# Patient Record
Sex: Female | Born: 1975 | Hispanic: No | Marital: Single | Smoking: Never smoker
Health system: Southern US, Community
[De-identification: ages and names within clinical notes are randomized; demographics above are authoritative.]

## PROBLEM LIST (undated history)

## (undated) DIAGNOSIS — E079 Disorder of thyroid, unspecified: Secondary | ICD-10-CM

## (undated) DIAGNOSIS — J302 Other seasonal allergic rhinitis: Secondary | ICD-10-CM

## (undated) DIAGNOSIS — D649 Anemia, unspecified: Secondary | ICD-10-CM

## (undated) HISTORY — DX: Anemia, unspecified: D64.9

## (undated) HISTORY — DX: Other seasonal allergic rhinitis: J30.2

## (undated) HISTORY — DX: Disorder of thyroid, unspecified: E07.9

---

## 1999-12-26 ENCOUNTER — Other Ambulatory Visit: Admission: RE | Admit: 1999-12-26 | Discharge: 1999-12-26 | Payer: Self-pay | Admitting: Obstetrics and Gynecology

## 2000-09-16 ENCOUNTER — Emergency Department (HOSPITAL_COMMUNITY): Admission: EM | Admit: 2000-09-16 | Discharge: 2000-09-17 | Payer: Self-pay | Admitting: Internal Medicine

## 2000-09-17 ENCOUNTER — Encounter: Payer: Self-pay | Admitting: Emergency Medicine

## 2001-06-03 ENCOUNTER — Other Ambulatory Visit: Admission: RE | Admit: 2001-06-03 | Discharge: 2001-06-03 | Payer: Self-pay | Admitting: *Deleted

## 2002-06-05 ENCOUNTER — Other Ambulatory Visit: Admission: RE | Admit: 2002-06-05 | Discharge: 2002-06-05 | Payer: Self-pay | Admitting: *Deleted

## 2003-02-19 ENCOUNTER — Other Ambulatory Visit: Admission: RE | Admit: 2003-02-19 | Discharge: 2003-02-19 | Payer: Self-pay | Admitting: Family Medicine

## 2004-05-02 ENCOUNTER — Emergency Department (HOSPITAL_COMMUNITY): Admission: EM | Admit: 2004-05-02 | Discharge: 2004-05-02 | Payer: Self-pay | Admitting: Emergency Medicine

## 2004-05-12 ENCOUNTER — Emergency Department (HOSPITAL_COMMUNITY): Admission: EM | Admit: 2004-05-12 | Discharge: 2004-05-12 | Payer: Self-pay | Admitting: Emergency Medicine

## 2004-06-27 ENCOUNTER — Other Ambulatory Visit: Admission: RE | Admit: 2004-06-27 | Discharge: 2004-06-27 | Payer: Self-pay | Admitting: Obstetrics and Gynecology

## 2004-12-27 ENCOUNTER — Other Ambulatory Visit: Admission: RE | Admit: 2004-12-27 | Discharge: 2004-12-27 | Payer: Self-pay | Admitting: Obstetrics and Gynecology

## 2005-03-27 ENCOUNTER — Other Ambulatory Visit: Admission: RE | Admit: 2005-03-27 | Discharge: 2005-03-27 | Payer: Self-pay | Admitting: Obstetrics and Gynecology

## 2006-02-20 ENCOUNTER — Other Ambulatory Visit: Admission: RE | Admit: 2006-02-20 | Discharge: 2006-02-20 | Payer: Self-pay | Admitting: Obstetrics and Gynecology

## 2006-12-04 HISTORY — PX: LAPAROSCOPY: SHX197

## 2007-01-17 ENCOUNTER — Emergency Department (HOSPITAL_COMMUNITY): Admission: EM | Admit: 2007-01-17 | Discharge: 2007-01-17 | Payer: Self-pay | Admitting: Emergency Medicine

## 2007-05-18 ENCOUNTER — Ambulatory Visit (HOSPITAL_COMMUNITY): Admission: RE | Admit: 2007-05-18 | Discharge: 2007-05-18 | Payer: Self-pay | Admitting: Obstetrics and Gynecology

## 2007-05-18 ENCOUNTER — Encounter (INDEPENDENT_AMBULATORY_CARE_PROVIDER_SITE_OTHER): Payer: Self-pay | Admitting: Obstetrics and Gynecology

## 2010-07-04 ENCOUNTER — Encounter: Admission: RE | Admit: 2010-07-04 | Discharge: 2010-07-04 | Payer: Self-pay | Admitting: Obstetrics and Gynecology

## 2011-04-18 NOTE — Op Note (Signed)
Summer Murillo, Summer Murillo                ACCOUNT NO.:  [id number]   MEDICAL RECORD NO.:  [id number]          PATIENT TYPE:  AMB   LOCATION:  [facility]C                           FACILITY:  [facility]   PHYSICIAN:  Hal Morales, M.D.DATE OF BIRTH:  Jul 03, 1976   DATE OF PROCEDURE:  05/18/2007  DATE OF DISCHARGE:                               OPERATIVE REPORT   PREOPERATIVE DIAGNOSIS:  Pelvic pain, family history of endometriosis.   POSTOPERATIVE DIAGNOSIS:  Probable endometriosis.   PROCEDURE:  Operative laparoscopy with peritoneal biopsies.   SURGEON:  Hal Morales, M.D.   ANESTHESIA:  General orotracheal.   ESTIMATED BLOOD LOSS:  Less than 10 mL.   COMPLICATIONS:  None.   FINDINGS:  The uterus, tubes and ovaries were within normal limits  without evidence of adhesions or endometriosis.  The anterior cul-de-sac  was free of any lesions.  The posterior cul-de-sac contained a stellate  peritoneal lesion in the right posterior cul-de-sac that could be  consistent with endometriosis.  There were also tiny peritoneal windows  in the peritoneum of the left uterosacral ligament and extending onto  the left pelvic sidewall.  There was a fairly classic powder burn area  directly adjacent to this.   DESCRIPTION OF PROCEDURE:  The patient was taken to the operating room  after appropriate identification and placed on the operating table.  After the attainment of adequate general anesthesia she was placed in  the modified lithotomy position.  The abdomen, perineum and vagina were  prepped with multiple layers of Betadine and a Foley catheter inserted  into the bladder and connected to straight drainage.  A single-tooth  tenaculum was placed on the anterior cervix.  An acorn cannula was  placed in the endocervical canal.  The abdomen was draped as a sterile  field.  Subumbilical and suprapubic injections of 0.25% Marcaine for  total of 10 mL were undertaken.  A subumbilical incision was made and  a  Veress cannula placed through that incision into the peritoneal cavity.  A pneumoperitoneum was created with 2.5 liters of CO2.  The Veress  cannula was removed and a 10-11 laparoscopic trocar placed through that  incision into the peritoneal cavity.  The laparoscope was placed through  the trocar sleeve.  A suprapubic incision was made to the left of  midline and a laparoscopic probe trocar placed through that incision  into the peritoneal cavity under direct visualization.  The above-noted  findings were made and documented.  Excisional biopsies of the two  aforementioned areas consistent with endometriosis were undertaken.  Hemostasis was noted to be adequate.  Irrigation was carried out with a  small amount of irrigant left in the peritoneal cavity.  All instruments  were then removed from the peritoneal cavity under direct visualization  as the CO2 was allowed to escape.  The subumbilical incision was closed  with a fascial suture of 0 Vicryl.  Subcuticular sutures of 0 Vicryl  were used to close the skin incision at the subumbilical and suprapubic  ports.  Sterile dressings were applied.  The acorn cannula and single-  tooth to the tenaculum were removed as was the Foley catheter.  The  patient was awakened from general anesthesia and taken to the recovery  room in satisfactory condition having tolerated the procedure well with  sponge and instrument counts correct.  She received Toradol 30 mg IV and  30 mg IM prior to leaving the operating suite.   SPECIMENS TO PATHOLOGY:  Peritoneal biopsies.   DISCHARGE INSTRUCTIONS:  Printed instructions for laparoscopy from the  [facility].   DISCHARGE MEDICATIONS:  Ibuprofen 600 mg p.o. q.6 h. p.r.n. pain,  Vicodin one to two p.o. q.4 h. p.r.n. severe pain.   FOLLOWUP:  The patient will follow up in 2 weeks.      Hal Morales, M.D.  Electronically Signed     VPH/MEDQ  D:  05/18/2007  T:  05/18/2007  Job:  [id number]

## 2011-04-18 NOTE — H&P (Signed)
NAME:  Summer Murillo, Summer Murillo                ACCOUNT NO.:  [id number]   MEDICAL RECORD NO.:  [id number]          PATIENT TYPE:  AMB   LOCATION:  SDC                           FACILITY:  [facility]   PHYSICIAN:  Hal Morales, M.D.DATE OF BIRTH:  Feb 23, 1976   DATE OF PROCEDURE:  DATE OF DISCHARGE:                    STAT - MUST CHANGE TO CORRECT WORK TYPE   HISTORY OF PRESENT ILLNESS:  The patient is a 35 year old, black, single  female, para 0, who presents for laparoscopic evaluation of pelvic pain.  The patient has a history of pelvic pain which dates back approximately  6 months ago.  The pain is primarily on placement of tampons.  She  actually denies any problems with dysmenorrhea or intermenstrual  abdominal pain.  She has regular menses, usually lasting for 4 days.  She is currently on Yasmin for control of her acne.  She had her last  menstrual period on Apr 10, 2007.  She has a strong family history of  endometriosis and wants to proceed with laparoscopic evaluation to  ensure that the above-mentioned symptomatology does not represent  endometriosis which needs to be managed.   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  She had low-grade SIL on colposcopically  directed biopsy.   OBSTETRICAL HISTORY:  Negative.   PAST MEDICAL HISTORY:  Significant only for allergies for which Zyrtec  has been required.   CURRENT MEDICATIONS:  Yasmin.   DRUG SENSITIVITIES:  PENICILLIN.   FAMILY HISTORY:  Positive for cancer, intestinal problems, hypertension,  diabetes, migraines, stroke, and emotional problems.   PAST SURGICAL HISTORY:  Removal of third molar.   REVIEW OF SYSTEMS:  Negative except as mentioned above.   PHYSICAL EXAMINATION:  VITAL SIGNS:  Blood pressure 100/60.  Height 5  feet 5-1/2 inches, weight 154 pounds.  HEENT:  Within normal limits.  LUNGS:  Clear.  HEART:  Regular rate and rhythm.  ABDOMEN:  Soft without masses  or organomegaly.  EXTREMITIES:  No clubbing, cyanosis, or edema.  PELVIC:  EGD and BUS within normal limits.  Vagina is rugous.  Cervix  without gross lesions.  The uterus is upper limits of normal size,  posterior, with mild tenderness on palpation.  Adnexa: No acceptable  masses.  Rectovaginal confirms.   IMPRESSION:  A six-month history of increasing abdominal and pelvic pain  with insertion of tampons and strong family history of endometriosis.   DISPOSITION:  The patient will undergo laparoscopic evaluation and  resection of endometriosis if possible. The risks of Anesthesia,  bleeding, infection, and damage to adjacent organs have all been  explained.  The additional risk of the inability to diagnose  endometriosis digitally has been explained, and the patient wishes to  proceed with laparoscopy.  This will be done at [facility] on May 18, 2007.      Hal Morales, M.D.     VPH/MEDQ  D:  05/06/2007  T:  05/06/2007  Job:  [id number]

## 2011-04-18 NOTE — H&P (Signed)
NAMEDAYSIE, HELF                ACCOUNT NO.:  [id number]   MEDICAL RECORD NO.:  [id number]          PATIENT TYPE:  AMB   LOCATION:  SDC                           [facility]  [facility]   PHYSICIAN:  Hal Morales, M.D.DATE OF BIRTH:  1976-11-11   DATE OF ADMISSION:  05/18/2007  DATE OF DISCHARGE:                              HISTORY & PHYSICAL   HISTORY OF PRESENT ILLNESS:  The patient is a 35 year old, black, single  female, para 0, who presents for laparoscopic evaluation of pelvic pain.  The patient has a history of pelvic pain which dates back approximately  6 months ago.  The pain is primarily on placement of tampons.  She  actually denies any problems with dysmenorrhea or intermenstrual  abdominal pain.  She has regular menses, usually lasting for 4 days.  She is currently on Yasmin for control of her acne.  She had her last  menstrual period on Apr 10, 2007.  She has a strong family history of  endometriosis and wants to proceed with laparoscopic evaluation to  ensure that the above-mentioned symptomatology does not represent  endometriosis which needs to be managed.   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  She had low-grade SIL on colposcopically  directed biopsy.   OBSTETRICAL HISTORY:  Negative.   PAST MEDICAL HISTORY:  Significant only for allergies for which Zyrtec  has been required.   CURRENT MEDICATIONS:  Yasmin.   DRUG SENSITIVITIES:  PENICILLIN.   FAMILY HISTORY:  Positive for cancer, intestinal problems, hypertension,  diabetes, migraines, stroke, and emotional problems.   PAST SURGICAL HISTORY:  Removal of third molar.   REVIEW OF SYSTEMS:  Negative except as mentioned above.   PHYSICAL EXAMINATION:  VITAL SIGNS:  Blood pressure 100/60.  Height 5  feet 5-1/2 inches, weight 154 pounds.  HEENT:  Within normal limits.  LUNGS:  Clear.  HEART:  Regular rate and rhythm.  ABDOMEN:  Soft without masses  or organomegaly.  EXTREMITIES:  No clubbing, cyanosis, or edema.  PELVIC:  EGD and BUS within normal limits.  Vagina is rugous.  Cervix  without gross lesions.  The uterus is upper limits of normal size,  posterior, with mild tenderness on palpation.  Adnexa: No acceptable  masses.  Rectovaginal confirms.   IMPRESSION:  A six-month history of increasing  pelvic pain with  insertion of tampons and strong family history of endometriosis.   DISPOSITION:  The patient will undergo laparoscopic evaluation and  resection of endometriosis if possible. The risks of Anesthesia,  bleeding, infection, and damage to adjacent organs have all been  explained.  The additional risk of the inability to diagnose  endometriosis visually has been explained, and the patient wishes to  proceed with laparoscopy.  This will be done at [facility] on May 18, 2007.      Hal Morales, M.D.  Electronically Signed     VPH/MEDQ  D:  05/06/2007  T:  05/06/2007  Job:  [id number]

## 2011-09-21 LAB — CBC
Platelets: 197
RBC: 5.14 — ABNORMAL HIGH

## 2011-09-21 LAB — PREGNANCY, URINE: Preg Test, Ur: NEGATIVE

## 2018-09-24 ENCOUNTER — Other Ambulatory Visit: Payer: Self-pay | Admitting: Obstetrics and Gynecology

## 2018-09-24 DIAGNOSIS — R928 Other abnormal and inconclusive findings on diagnostic imaging of breast: Secondary | ICD-10-CM

## 2018-09-27 ENCOUNTER — Other Ambulatory Visit: Payer: Self-pay | Admitting: Obstetrics and Gynecology

## 2018-09-27 ENCOUNTER — Ambulatory Visit
Admission: RE | Admit: 2018-09-27 | Discharge: 2018-09-27 | Disposition: A | Discharge status: Home / Self Care | Payer: Managed Care, Other (non HMO) | Source: Ambulatory Visit | Attending: Obstetrics and Gynecology | Admitting: Obstetrics and Gynecology

## 2018-09-27 DIAGNOSIS — N6489 Other specified disorders of breast: Secondary | ICD-10-CM

## 2018-09-27 DIAGNOSIS — R928 Other abnormal and inconclusive findings on diagnostic imaging of breast: Secondary | ICD-10-CM

## 2018-10-01 ENCOUNTER — Other Ambulatory Visit: Payer: Self-pay | Admitting: Obstetrics and Gynecology

## 2018-10-01 DIAGNOSIS — N6489 Other specified disorders of breast: Secondary | ICD-10-CM

## 2020-06-16 IMAGING — MG DIGITAL DIAGNOSTIC UNILATERAL RIGHT MAMMOGRAM WITH TOMO AND CAD
4 series · 4 of 12 positions shown · non-contrast
Comparison: Baseline screening mammogram 09/09/2018.

CLINICAL DATA: Screening recall from baseline for a right breast
asymmetry.

EXAM:
DIGITAL DIAGNOSTIC RIGHT MAMMOGRAM WITH CAD AND TOMO
ULTRASOUND RIGHT BREAST

[R ML synth-2D]
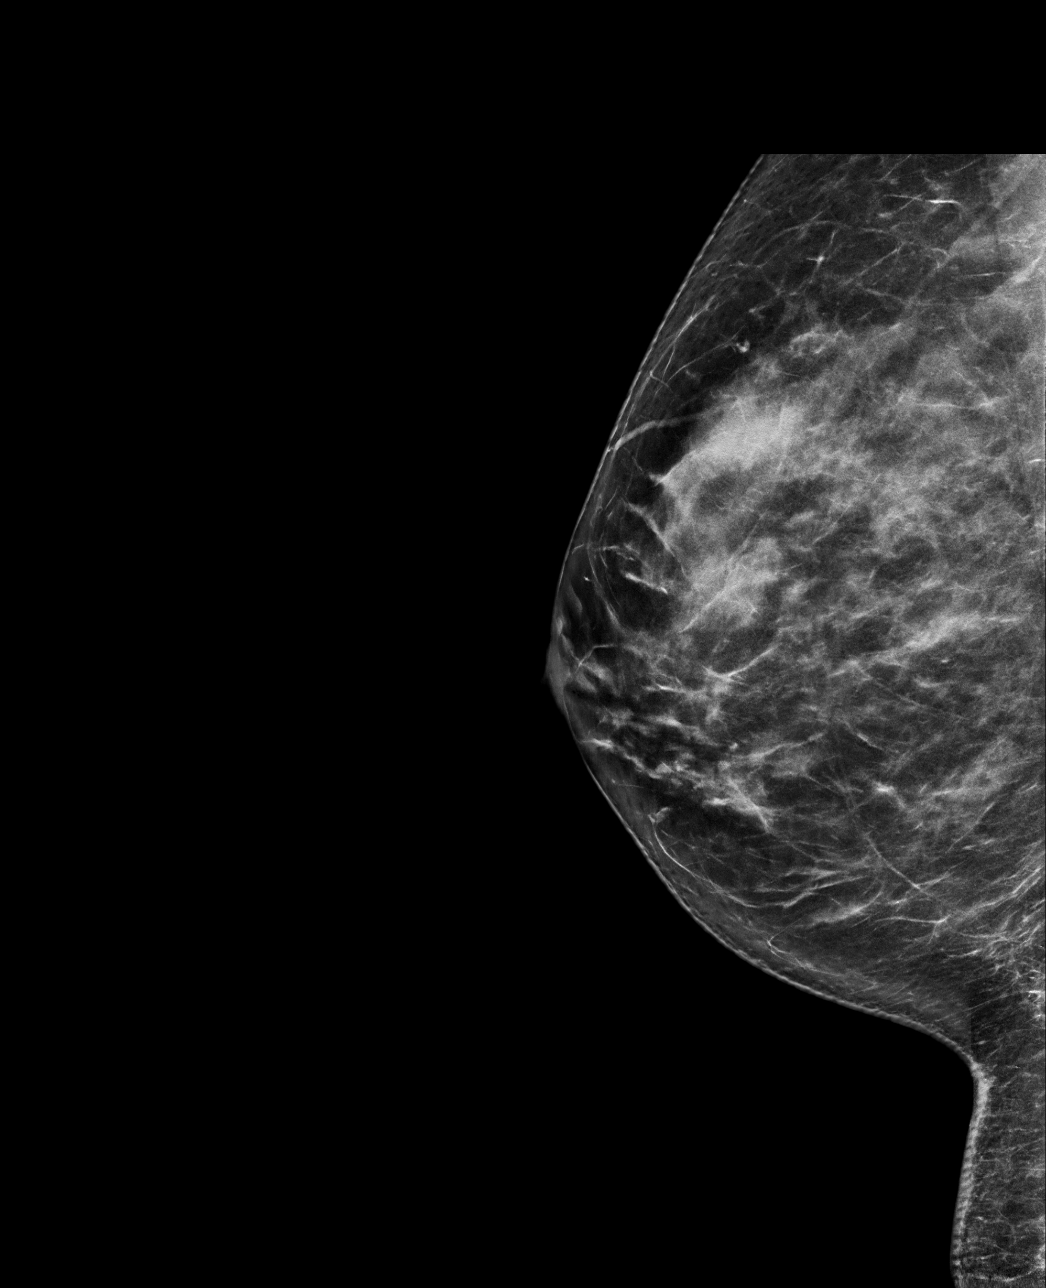

[R MLO synth-2D]
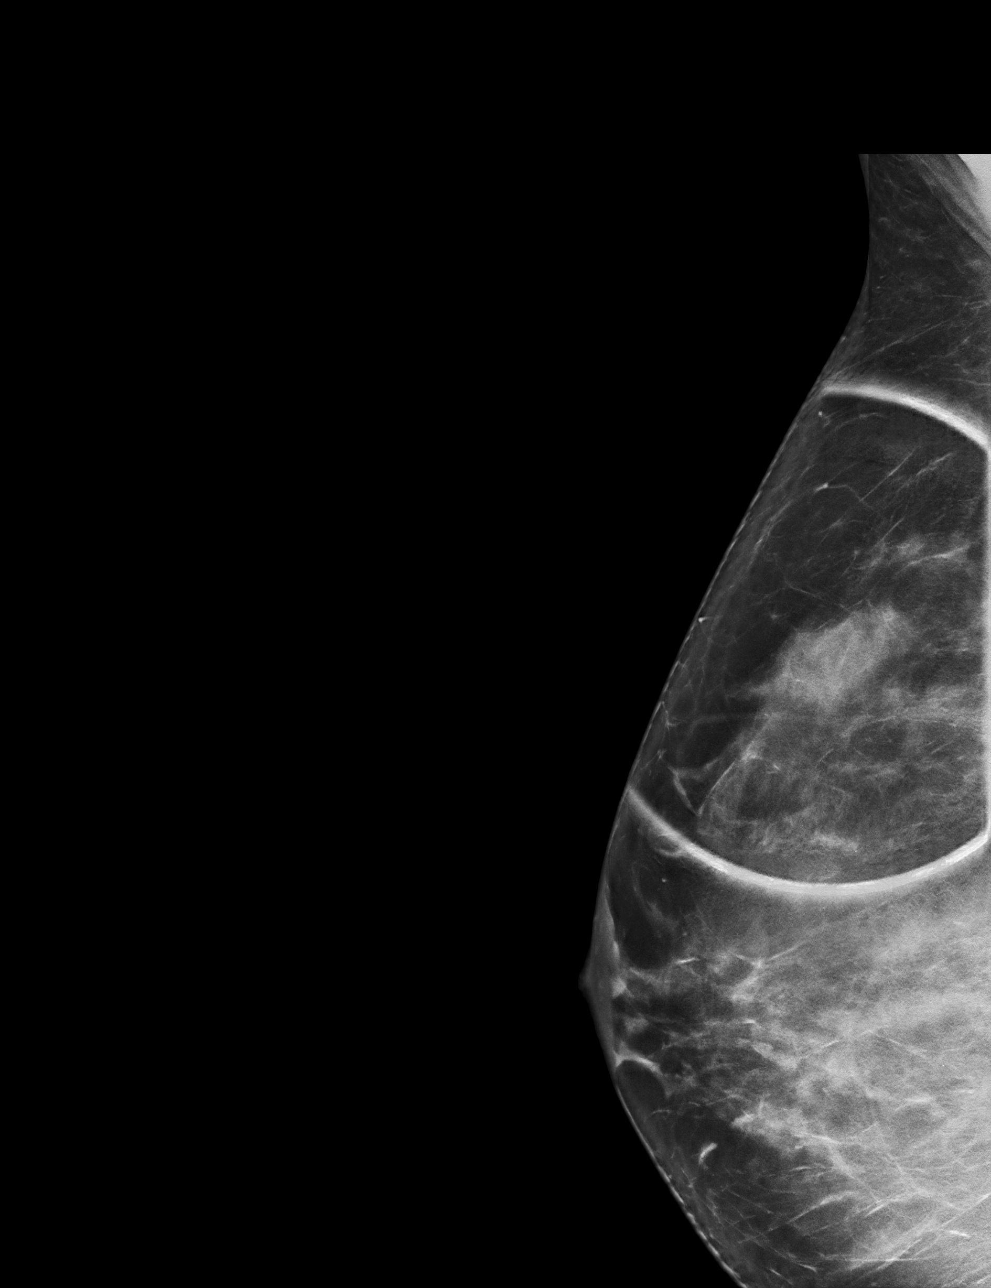

[R ML tomo · tomo slice 43/84.0]
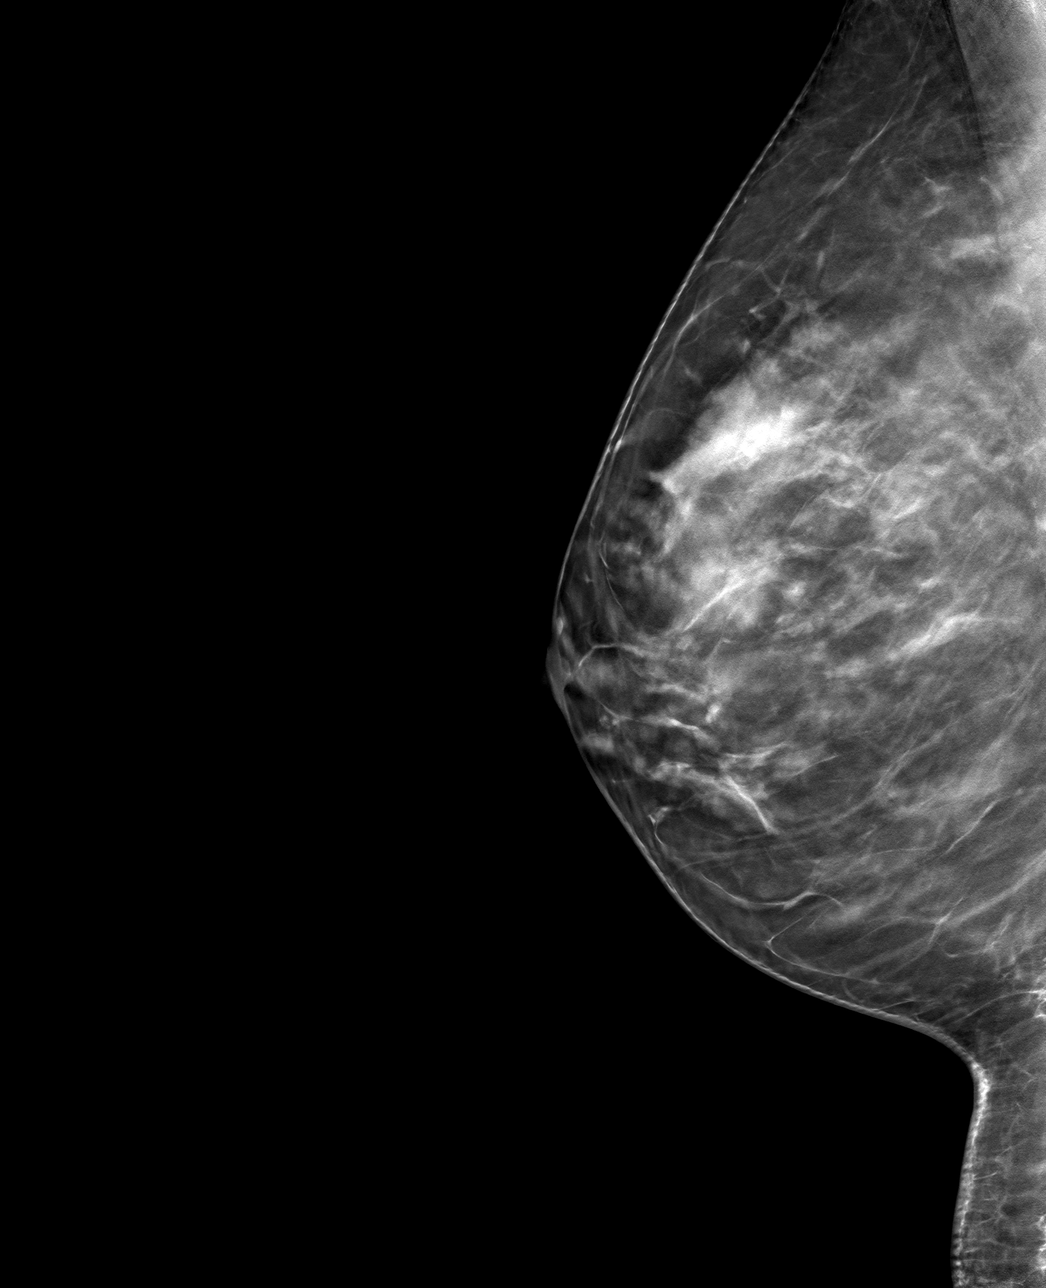

[R MLO tomo · tomo slice 39/76.0]
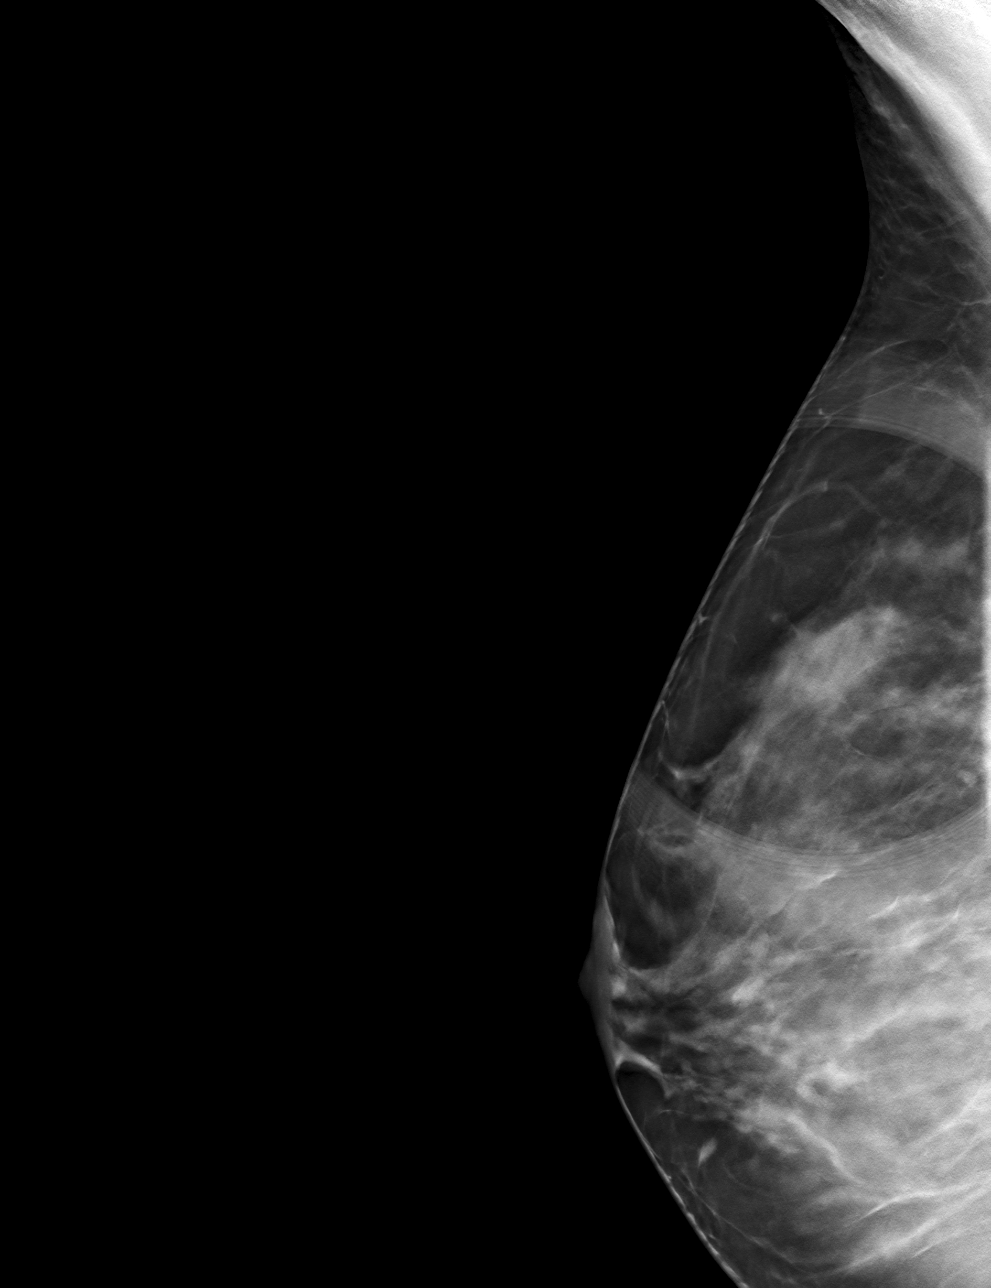

[4 of 12 positions shown; findings below may reference images not displayed]

ACR Breast Density Category c: The breast tissue is heterogeneously
dense, which may obscure small masses.
FINDINGS: There is a persistent asymmetry in the superior to slightly upper
outer quadrant of the right breast. On the spot compression
tomosynthesis images the asymmetry measures approximately 2.5 cm.

Mammographic images were processed with CAD.

On physical exam, no suspicious palpable masses are identified in
the superior or upper-outer right breast

Ultrasound of the upper-outer quadrant of the right breast
demonstrates normal dense fibroglandular tissue. No masses or
suspicious areas of shadowing are identified.
IMPRESSION: There is an asymmetry in the upper-outer right breast without a
sonographic correlate. This finding is probably benign.

RECOMMENDATION:
Six-month follow-up diagnostic right breast mammogram is
recommended.

I have discussed the findings and recommendations with the patient.
Results were also provided in writing at the conclusion of the
visit. If applicable, a reminder letter will be sent to the patient
regarding the next appointment.

BI-RADS CATEGORY  3: Probably benign.

## 2021-10-10 ENCOUNTER — Other Ambulatory Visit: Payer: Self-pay | Admitting: Licensed Clinical Social Worker

## 2021-10-10 ENCOUNTER — Other Ambulatory Visit: Payer: Self-pay | Admitting: Obstetrics & Gynecology

## 2021-10-10 DIAGNOSIS — Z09 Encounter for follow-up examination after completed treatment for conditions other than malignant neoplasm: Secondary | ICD-10-CM

## 2021-11-09 ENCOUNTER — Other Ambulatory Visit: Payer: Self-pay | Admitting: Obstetrics & Gynecology

## 2021-11-09 DIAGNOSIS — N6489 Other specified disorders of breast: Secondary | ICD-10-CM

## 2021-11-14 ENCOUNTER — Other Ambulatory Visit: Payer: Self-pay

## 2021-11-14 ENCOUNTER — Ambulatory Visit
Admission: RE | Admit: 2021-11-14 | Discharge: 2021-11-14 | Disposition: A | Discharge status: Home / Self Care | Payer: 59 | Source: Ambulatory Visit | Attending: Obstetrics and Gynecology | Admitting: Obstetrics and Gynecology

## 2021-11-14 DIAGNOSIS — N6489 Other specified disorders of breast: Secondary | ICD-10-CM

## 2022-06-21 DIAGNOSIS — M9901 Segmental and somatic dysfunction of cervical region: Secondary | ICD-10-CM | POA: Diagnosis not present

## 2022-06-21 DIAGNOSIS — M9905 Segmental and somatic dysfunction of pelvic region: Secondary | ICD-10-CM | POA: Diagnosis not present

## 2022-06-21 DIAGNOSIS — M9902 Segmental and somatic dysfunction of thoracic region: Secondary | ICD-10-CM | POA: Diagnosis not present

## 2022-06-21 DIAGNOSIS — M9903 Segmental and somatic dysfunction of lumbar region: Secondary | ICD-10-CM | POA: Diagnosis not present

## 2022-06-26 NOTE — Progress Notes (Unsigned)
NEW PT ESTABLISH CARE  Assessment and Plan:   Summer Murillo was seen today for establish care.  Diagnoses and all orders for this visit:  Encounter for general adult medical examination with abnormal findings Due Yearly Mammogram due 12/23 Follows with GYN for PAP  Screening for ischemic heart disease -     EKG 12-Lead  Screening for hematuria or proteinuria -     Urinalysis, Routine w reflex microscopic -     Microalbumin / creatinine urine ratio  Screening for thyroid disorder -     TSH  Screening, lipid -     Lipid panel  Screening for diabetes mellitus -     Hemoglobin A1c  Other iron deficiency anemia Previously was on iron in the past -     CBC with Differential/Platelet -     Iron, Total/Total Iron Binding Cap -     Ferritin  Vitamin D deficiency -     VITAMIN D 25 Hydroxy (Vit-D Deficiency, Fractures)  Frequent urination at night -     Urine Culture  Medication management -     CBC with Differential/Platelet -     COMPLETE METABOLIC PANEL WITH GFR -     Magnesium -     Lipid panel -     TSH -     Hemoglobin A1c -     VITAMIN D 25 Hydroxy (Vit-D Deficiency, Fractures) -     EKG 12-Lead -     Urinalysis, Routine w reflex microscopic -     Microalbumin / creatinine urine ratio -     Iron, Total/Total Iron Binding Cap -     Ferritin  Screening for endocrine, metabolic and immunity disorder -     COMPLETE METABOLIC PANEL WITH GFR       Continue diet and meds as discussed. Further disposition pending results of labs. Discussed med's effects and SE's.   Over 30 minutes of exam, counseling, chart review, and critical decision making was performed.   No future appointments.   ----------------------------------------------------------------------------------------------------------------------  HPI 46 y.o. female  presents for 3 month follow up on hypertension, cholesterol, diabetes, weight and vitamin D deficiency.   BMI is Body mass index is 28.42  kg/m., she has been working on diet and exercise. She is walking and does eat lots of fresh fruits and vegetables.  Limits saturated fats and simple carbs.  Wt Readings from Last 3 Encounters:  06/27/22 173 lb 6.4 oz (78.7 kg)    Her blood pressure has been controlled at home, today their BP is BP: 110/80  She does workout. She denies chest pain, shortness of breath, dizziness.   She is not on cholesterol medication , has never been elevated  Glucose has never been elevated  She does have to get up a lot at night to urinate. Getting up 4-5 times a night. Has no frequency of urination during the day.   Does have pain in neck and lower back and is being evaluated by chiropractor.   Current Medications:  Current Outpatient Medications on File Prior to Visit  Medication Sig   chlorpheniramine (CHLOR-TRIMETON) 4 MG tablet Take 4 mg by mouth 2 (two) times daily as needed for allergies.   MAGNESIUM CITRATE PO Take by mouth. 900mg    Multiple Vitamins-Minerals (MULTI-VITAMIN GUMMIES) CHEW Chew by mouth.   OVER THE COUNTER MEDICATION Allergena Allergy Relief drops   OVER THE COUNTER MEDICATION Tumeric curcumin & Ginger w/ black pepper extract   No current facility-administered medications on file  prior to visit.     Allergies:  Allergies  Allergen Reactions   Erythromycin Hives and Swelling   Penicillins Anaphylaxis and Hives    As a child unsure now     Medical History:  Past Medical History:  Diagnosis Date   Anemia    Family history- Reviewed and unchanged Social history- Reviewed and unchanged   Review of Systems:  Review of Systems  Constitutional:  Negative for chills and fever.  HENT:  Negative for congestion, hearing loss, sinus pain, sore throat and tinnitus.   Eyes:  Negative for blurred vision and double vision.  Respiratory:  Negative for cough, hemoptysis, sputum production, shortness of breath and wheezing.   Cardiovascular:  Negative for chest pain,  palpitations and leg swelling.  Gastrointestinal:  Negative for abdominal pain, constipation, diarrhea, heartburn, nausea and vomiting.  Genitourinary:  Negative for dysuria and urgency.       Nighttime urination  Musculoskeletal:  Positive for back pain (lower) and neck pain. Negative for falls, joint pain and myalgias.  Skin:  Negative for rash.  Neurological:  Negative for dizziness, tingling, tremors, weakness and headaches.  Endo/Heme/Allergies:  Does not bruise/bleed easily.  Psychiatric/Behavioral:  Negative for depression and suicidal ideas. The patient is not nervous/anxious and does not have insomnia.       Physical Exam: BP 110/80   Pulse 64   Temp (!) 97.5 F (36.4 C)   Ht 5' 5.5" (1.664 m)   Wt 173 lb 6.4 oz (78.7 kg)   LMP 06/07/2022   SpO2 99%   BMI 28.42 kg/m  Wt Readings from Last 3 Encounters:  06/27/22 173 lb 6.4 oz (78.7 kg)   General Appearance: Well nourished, in no apparent distress. Eyes: PERRLA, EOMs, conjunctiva no swelling or erythema Sinuses: No Frontal/maxillary tenderness ENT/Mouth: Ext aud canals clear, TMs without erythema, bulging. No erythema, swelling, or exudate on post pharynx.  Tonsils not swollen or erythematous. Hearing normal.  Neck: Supple, thyroid normal.  Respiratory: Respiratory effort normal, BS equal bilaterally without rales, rhonchi, wheezing or stridor.  Cardio: RRR with no MRGs. Brisk peripheral pulses without edema.  Abdomen: Soft, + BS.  Non tender, no guarding, rebound, hernias, masses. Lymphatics: Non tender without lymphadenopathy.  Musculoskeletal: Full ROM, 5/5 strength, Normal gait Skin: Warm, dry without rashes, lesions, ecchymosis.  Neuro: Cranial nerves intact. No cerebellar symptoms.  Psych: Awake and oriented X 3, normal affect, Insight and Judgment appropriate.  EKG: Sinus bradycardia, no ST changes  Tajai Suder Hollie Salk, NP 3:34 PM [facility]

## 2022-06-27 ENCOUNTER — Encounter: Payer: Self-pay | Admitting: Nurse Practitioner

## 2022-06-27 ENCOUNTER — Ambulatory Visit: Payer: 59 | Admitting: Nurse Practitioner

## 2022-06-27 VITALS — BP 110/80 | HR 64 | Temp 97.5°F | Ht 65.5 in | Wt 173.4 lb

## 2022-06-27 DIAGNOSIS — Z1329 Encounter for screening for other suspected endocrine disorder: Secondary | ICD-10-CM

## 2022-06-27 DIAGNOSIS — E559 Vitamin D deficiency, unspecified: Secondary | ICD-10-CM | POA: Diagnosis not present

## 2022-06-27 DIAGNOSIS — I10 Essential (primary) hypertension: Secondary | ICD-10-CM | POA: Diagnosis not present

## 2022-06-27 DIAGNOSIS — Z1389 Encounter for screening for other disorder: Secondary | ICD-10-CM | POA: Diagnosis not present

## 2022-06-27 DIAGNOSIS — Z13220 Encounter for screening for lipoid disorders: Secondary | ICD-10-CM

## 2022-06-27 DIAGNOSIS — Z136 Encounter for screening for cardiovascular disorders: Secondary | ICD-10-CM | POA: Diagnosis not present

## 2022-06-27 DIAGNOSIS — R351 Nocturia: Secondary | ICD-10-CM

## 2022-06-27 DIAGNOSIS — Z0001 Encounter for general adult medical examination with abnormal findings: Secondary | ICD-10-CM

## 2022-06-27 DIAGNOSIS — D508 Other iron deficiency anemias: Secondary | ICD-10-CM | POA: Diagnosis not present

## 2022-06-27 DIAGNOSIS — Z0000 Encounter for general adult medical examination without abnormal findings: Secondary | ICD-10-CM

## 2022-06-27 DIAGNOSIS — Z131 Encounter for screening for diabetes mellitus: Secondary | ICD-10-CM

## 2022-06-27 DIAGNOSIS — Z130 Encounter for screening for diseases of the blood and blood-forming organs and certain disorders involving the immune mechanism: Secondary | ICD-10-CM | POA: Diagnosis not present

## 2022-06-27 DIAGNOSIS — Z79899 Other long term (current) drug therapy: Secondary | ICD-10-CM

## 2022-06-27 DIAGNOSIS — Z13228 Encounter for screening for other metabolic disorders: Secondary | ICD-10-CM | POA: Diagnosis not present

## 2022-06-27 NOTE — Patient Instructions (Signed)

## 2022-06-28 LAB — CBC WITH DIFFERENTIAL/PLATELET
Absolute Monocytes: 373 cells/uL (ref 200–950)
Basophils Absolute: 48 cells/uL (ref 0–200)
Basophils Relative: 0.7 %
Eosinophils Absolute: 131 cells/uL (ref 15–500)
Eosinophils Relative: 1.9 %
HCT: 41.3 % (ref 35.0–45.0)
Hemoglobin: 12.9 g/dL (ref 11.7–15.5)
Lymphs Abs: 3395 cells/uL (ref 850–3900)
MCH: 23.1 pg — ABNORMAL LOW (ref 27.0–33.0)
MCHC: 31.2 g/dL — ABNORMAL LOW (ref 32.0–36.0)
MCV: 74 fL — ABNORMAL LOW (ref 80.0–100.0)
MPV: 12.8 fL — ABNORMAL HIGH (ref 7.5–12.5)
Monocytes Relative: 5.4 %
Neutro Abs: 2953 cells/uL (ref 1500–7800)
Neutrophils Relative %: 42.8 %
Platelets: 225 10*3/uL (ref 140–400)
RBC: 5.58 10*6/uL — ABNORMAL HIGH (ref 3.80–5.10)
RDW: 12.6 % (ref 11.0–15.0)
Total Lymphocyte: 49.2 %
WBC: 6.9 10*3/uL (ref 3.8–10.8)

## 2022-06-28 LAB — LIPID PANEL
Cholesterol: 185 mg/dL (ref <200)
HDL: 71 mg/dL (ref >=50)
LDL Cholesterol (Calc): 101 mg/dL (calc) — ABNORMAL HIGH
Non-HDL Cholesterol (Calc): 114 mg/dL (calc) (ref <130)
Total CHOL/HDL Ratio: 2.6 (calc) (ref <5.0)
Triglycerides: 50 mg/dL (ref <150)

## 2022-06-28 LAB — URINE CULTURE
MICRO NUMBER:: 13692339
SPECIMEN QUALITY:: ADEQUATE

## 2022-06-28 LAB — MICROALBUMIN / CREATININE URINE RATIO
Creatinine, Urine: 35 mg/dL (ref 20–275)
Microalb, Ur: <0.2 mg/dL

## 2022-06-28 LAB — URINALYSIS, ROUTINE W REFLEX MICROSCOPIC
Bilirubin Urine: NEGATIVE
Glucose, UA: NEGATIVE
Hgb urine dipstick: NEGATIVE
Ketones, ur: NEGATIVE
Leukocytes,Ua: NEGATIVE
Nitrite: NEGATIVE
Protein, ur: NEGATIVE
Specific Gravity, Urine: 1.004 (ref 1.001–1.035)
pH: 6 (ref 5.0–8.0)

## 2022-06-28 LAB — MAGNESIUM: Magnesium: 2.1 mg/dL (ref 1.5–2.5)

## 2022-06-28 LAB — COMPLETE METABOLIC PANEL WITH GFR
AG Ratio: 1.4 (calc) (ref 1.0–2.5)
ALT: 11 U/L (ref 6–29)
AST: 15 U/L (ref 10–30)
Albumin: 4.4 g/dL (ref 3.6–5.1)
Alkaline phosphatase (APISO): 68 U/L (ref 31–125)
BUN: 12 mg/dL (ref 7–25)
CO2: 27 mmol/L (ref 20–32)
Calcium: 9.8 mg/dL (ref 8.6–10.2)
Chloride: 102 mmol/L (ref 98–110)
Creat: 0.63 mg/dL (ref 0.50–0.99)
Globulin: 3.2 g/dL (calc) (ref 1.9–3.7)
Glucose, Bld: 76 mg/dL (ref 65–99)
Potassium: 4.1 mmol/L (ref 3.5–5.3)
Sodium: 138 mmol/L (ref 135–146)
Total Bilirubin: 0.5 mg/dL (ref 0.2–1.2)
Total Protein: 7.6 g/dL (ref 6.1–8.1)
eGFR: 112 mL/min/{1.73_m2} (ref >=60)

## 2022-06-28 LAB — VITAMIN D 25 HYDROXY (VIT D DEFICIENCY, FRACTURES): Vit D, 25-Hydroxy: 30 ng/mL (ref 30–100)

## 2022-06-28 LAB — IRON, TOTAL/TOTAL IRON BINDING CAP
%SAT: 23 % (calc) (ref 16–45)
Iron: 85 ug/dL (ref 40–190)
TIBC: 374 mcg/dL (calc) (ref 250–450)

## 2022-06-28 LAB — HEMOGLOBIN A1C
Hgb A1c MFr Bld: 5.1 % of total Hgb (ref <5.7)
Mean Plasma Glucose: 100 mg/dL
eAG (mmol/L): 5.5 mmol/L

## 2022-06-28 LAB — TSH: TSH: 6.22 mIU/L — ABNORMAL HIGH

## 2022-06-28 LAB — FERRITIN: Ferritin: 22 ng/mL (ref 16–232)

## 2022-06-29 ENCOUNTER — Other Ambulatory Visit: Payer: Self-pay | Admitting: Nurse Practitioner

## 2022-06-29 ENCOUNTER — Telehealth: Payer: Self-pay | Admitting: Nurse Practitioner

## 2022-06-29 DIAGNOSIS — E039 Hypothyroidism, unspecified: Secondary | ICD-10-CM

## 2022-06-29 NOTE — Telephone Encounter (Signed)
Called pt to get NV scheduled in 2 weeks but after hearing her estimated cost from Angie in the lab she isn't sure that she can afford lab work as frequently. I told her to call her insurance and see if they will cover a specific test being covered. Not sure what to do for her besides offering her the self pay price and discounting 30%? Please advise.

## 2022-06-30 NOTE — Telephone Encounter (Signed)
I would recommend we start her on Levothyroxine 50 mcg daily and have her come for recheck in 3 months.  Please let me know if she is agreeable to this

## 2023-07-02 NOTE — Progress Notes (Unsigned)
COMPLETE PHYSICAL  Assessment and Plan:   Mistydawn was seen today for establish care.  Diagnoses and all orders for this visit:  Encounter for general adult medical examination with abnormal findings Due Yearly Mammogram due 12/23 Follows with GYN for PAP  Screening for ischemic heart disease -     EKG 12-Lead  Screening for hematuria or proteinuria -     Urinalysis, Routine w reflex microscopic -     Microalbumin / creatinine urine ratio  Hypothyroidism Not currently on medication -     TSH  Mixed hyperlipidemia Continue diet and exercise -     Lipid panel  Screening for diabetes mellitus -     Hemoglobin A1c  Other iron deficiency anemia Previously was on iron in the past -     CBC with Differential/Platelet -     Iron, Total/Total Iron Binding Cap -     Ferritin  Vitamin D deficiency Continue Vit D supplementation to maintain value in therapeutic level of 60-100  -     VITAMIN D 25 Hydroxy (Vit-D Deficiency, Fractures)   Medication management -     CBC with Differential/Platelet -     COMPLETE METABOLIC PANEL WITH GFR -     Magnesium -     Lipid panel -     TSH -     Hemoglobin A1c -     VITAMIN D 25 Hydroxy (Vit-D Deficiency, Fractures) -     EKG 12-Lead -     Urinalysis, Routine w reflex microscopic -     Microalbumin / creatinine urine ratio -     Iron, Total/Total Iron Binding Cap -     Ferritin        Continue diet and meds as discussed. Further disposition pending results of labs. Discussed med's effects and SE's.   Over 30 minutes of exam, counseling, chart review, and critical decision making was performed.   Future Appointments  Date Time Provider Department Center  07/03/2023  3:00 PM Raynelle Dick, NP [facility] None  07/02/2024  2:00 PM Raynelle Dick, NP [facility] None     ----------------------------------------------------------------------------------------------------------------------  HPI 47 y.o. female  presents for  3 month follow up on hypertension, cholesterol, diabetes, weight and vitamin D deficiency.   BMI is There is no height or weight on file to calculate BMI., she has been working on diet and exercise. She is walking and does eat lots of fresh fruits and vegetables.  Limits saturated fats and simple carbs.  Wt Readings from Last 3 Encounters:  06/27/22 173 lb 6.4 oz (78.7 kg)    Her blood pressure has been controlled at home, today their BP is    She does workout. She denies chest pain, shortness of breath, dizziness.   She is not on cholesterol medication , has never been elevated  Glucose has never been elevated  She does have to get up a lot at night to urinate. Getting up 4-5 times a night. Has no frequency of urination during the day.   Does have pain in neck and lower back and is being evaluated by chiropractor.   Current Medications:  Current Outpatient Medications on File Prior to Visit  Medication Sig   chlorpheniramine (CHLOR-TRIMETON) 4 MG tablet Take 4 mg by mouth 2 (two) times daily as needed for allergies.   MAGNESIUM CITRATE PO Take by mouth. 900mg    Multiple Vitamins-Minerals (MULTI-VITAMIN GUMMIES) CHEW Chew by mouth.   OVER THE COUNTER MEDICATION Allergena Allergy Relief drops  OVER THE COUNTER MEDICATION Tumeric curcumin & Ginger w/ black pepper extract   No current facility-administered medications on file prior to visit.     Allergies:  Allergies  Allergen Reactions   Erythromycin Hives and Swelling   Penicillins Anaphylaxis and Hives    As a child unsure now     Medical History:  Past Medical History:  Diagnosis Date   Anemia    Family history- Reviewed and unchanged Social history- Reviewed and unchanged   Review of Systems:  Review of Systems  Constitutional:  Negative for chills and fever.  HENT:  Negative for congestion, hearing loss, sinus pain, sore throat and tinnitus.   Eyes:  Negative for blurred vision and double vision.   Respiratory:  Negative for cough, hemoptysis, sputum production, shortness of breath and wheezing.   Cardiovascular:  Negative for chest pain, palpitations and leg swelling.  Gastrointestinal:  Negative for abdominal pain, constipation, diarrhea, heartburn, nausea and vomiting.  Genitourinary:  Negative for dysuria and urgency.       Nighttime urination  Musculoskeletal:  Positive for back pain (lower) and neck pain. Negative for falls, joint pain and myalgias.  Skin:  Negative for rash.  Neurological:  Negative for dizziness, tingling, tremors, weakness and headaches.  Endo/Heme/Allergies:  Does not bruise/bleed easily.  Psychiatric/Behavioral:  Negative for depression and suicidal ideas. The patient is not nervous/anxious and does not have insomnia.       Physical Exam: There were no vitals taken for this visit. Wt Readings from Last 3 Encounters:  06/27/22 173 lb 6.4 oz (78.7 kg)   General Appearance: Well nourished, in no apparent distress. Eyes: PERRLA, EOMs, conjunctiva no swelling or erythema Sinuses: No Frontal/maxillary tenderness ENT/Mouth: Ext aud canals clear, TMs without erythema, bulging. No erythema, swelling, or exudate on post pharynx.  Tonsils not swollen or erythematous. Hearing normal.  Neck: Supple, thyroid normal.  Respiratory: Respiratory effort normal, BS equal bilaterally without rales, rhonchi, wheezing or stridor.  Cardio: RRR with no MRGs. Brisk peripheral pulses without edema.  Abdomen: Soft, + BS.  Non tender, no guarding, rebound, hernias, masses. Lymphatics: Non tender without lymphadenopathy.  Musculoskeletal: Full ROM, 5/5 strength, Normal gait Skin: Warm, dry without rashes, lesions, ecchymosis.  Neuro: Cranial nerves intact. No cerebellar symptoms.  Psych: Awake and oriented X 3, normal affect, Insight and Judgment appropriate.  EKG: Sinus bradycardia, no ST changes  Georgie Haque Hollie Salk, NP 12:51 PM [facility]

## 2023-07-03 ENCOUNTER — Ambulatory Visit (INDEPENDENT_AMBULATORY_CARE_PROVIDER_SITE_OTHER): Payer: 59 | Admitting: Nurse Practitioner

## 2023-07-03 ENCOUNTER — Encounter: Payer: Self-pay | Admitting: Nurse Practitioner

## 2023-07-03 VITALS — BP 118/78 | HR 72 | Temp 97.7°F | Ht 65.5 in | Wt 169.4 lb

## 2023-07-03 DIAGNOSIS — Z0000 Encounter for general adult medical examination without abnormal findings: Secondary | ICD-10-CM

## 2023-07-03 DIAGNOSIS — E782 Mixed hyperlipidemia: Secondary | ICD-10-CM | POA: Diagnosis not present

## 2023-07-03 DIAGNOSIS — E559 Vitamin D deficiency, unspecified: Secondary | ICD-10-CM

## 2023-07-03 DIAGNOSIS — I10 Essential (primary) hypertension: Secondary | ICD-10-CM | POA: Diagnosis not present

## 2023-07-03 DIAGNOSIS — Z0001 Encounter for general adult medical examination with abnormal findings: Secondary | ICD-10-CM

## 2023-07-03 DIAGNOSIS — Z136 Encounter for screening for cardiovascular disorders: Secondary | ICD-10-CM

## 2023-07-03 DIAGNOSIS — E039 Hypothyroidism, unspecified: Secondary | ICD-10-CM | POA: Diagnosis not present

## 2023-07-03 DIAGNOSIS — D508 Other iron deficiency anemias: Secondary | ICD-10-CM | POA: Diagnosis not present

## 2023-07-03 DIAGNOSIS — Z131 Encounter for screening for diabetes mellitus: Secondary | ICD-10-CM | POA: Diagnosis not present

## 2023-07-03 DIAGNOSIS — Z79899 Other long term (current) drug therapy: Secondary | ICD-10-CM | POA: Diagnosis not present

## 2023-07-03 DIAGNOSIS — Z1389 Encounter for screening for other disorder: Secondary | ICD-10-CM

## 2023-07-03 DIAGNOSIS — D649 Anemia, unspecified: Secondary | ICD-10-CM | POA: Insufficient documentation

## 2023-07-03 LAB — CBC WITH DIFFERENTIAL/PLATELET
Absolute Monocytes: 342 cells/uL (ref 200–950)
Basophils Absolute: 50 cells/uL (ref 0–200)
Basophils Relative: 0.9 %
Eosinophils Absolute: 78 cells/uL (ref 15–500)
Eosinophils Relative: 1.4 %
HCT: 40.6 % (ref 35.0–45.0)
Hemoglobin: 12.3 g/dL (ref 11.7–15.5)
Lymphs Abs: 2996 cells/uL (ref 850–3900)
MCH: 22.5 pg — ABNORMAL LOW (ref 27.0–33.0)
MCHC: 30.3 g/dL — ABNORMAL LOW (ref 32.0–36.0)
MCV: 74.4 fL — ABNORMAL LOW (ref 80.0–100.0)
MPV: 13.4 fL — ABNORMAL HIGH (ref 7.5–12.5)
Monocytes Relative: 6.1 %
Neutro Abs: 2134 cells/uL (ref 1500–7800)
Neutrophils Relative %: 38.1 %
Platelets: 213 10*3/uL (ref 140–400)
RBC: 5.46 10*6/uL — ABNORMAL HIGH (ref 3.80–5.10)
RDW: 13.1 % (ref 11.0–15.0)
Total Lymphocyte: 53.5 %
WBC: 5.6 10*3/uL (ref 3.8–10.8)

## 2023-07-03 NOTE — Patient Instructions (Signed)

## 2023-12-19 ENCOUNTER — Ambulatory Visit: Payer: 59 | Admitting: Nurse Practitioner

## 2024-07-02 ENCOUNTER — Encounter: Payer: 59 | Admitting: Nurse Practitioner

## 2024-07-22 DIAGNOSIS — Z0000 Encounter for general adult medical examination without abnormal findings: Secondary | ICD-10-CM | POA: Diagnosis not present

## 2024-07-22 DIAGNOSIS — Z1231 Encounter for screening mammogram for malignant neoplasm of breast: Secondary | ICD-10-CM | POA: Diagnosis not present

## 2024-07-22 DIAGNOSIS — Z1389 Encounter for screening for other disorder: Secondary | ICD-10-CM | POA: Diagnosis not present

## 2024-09-02 ENCOUNTER — Ambulatory Visit: Admitting: Family Medicine

## 2024-09-02 ENCOUNTER — Encounter: Payer: Self-pay | Admitting: Family Medicine

## 2024-09-02 VITALS — BP 115/77 | HR 70 | Ht 65.5 in | Wt 155.0 lb

## 2024-09-02 DIAGNOSIS — E611 Iron deficiency: Secondary | ICD-10-CM | POA: Diagnosis not present

## 2024-09-02 DIAGNOSIS — E559 Vitamin D deficiency, unspecified: Secondary | ICD-10-CM

## 2024-09-02 DIAGNOSIS — Z0000 Encounter for general adult medical examination without abnormal findings: Secondary | ICD-10-CM | POA: Diagnosis not present

## 2024-09-02 DIAGNOSIS — Z131 Encounter for screening for diabetes mellitus: Secondary | ICD-10-CM | POA: Diagnosis not present

## 2024-09-02 DIAGNOSIS — Z1211 Encounter for screening for malignant neoplasm of colon: Secondary | ICD-10-CM

## 2024-09-02 LAB — COMPREHENSIVE METABOLIC PANEL WITH GFR
ALT: 12 U/L (ref 0–35)
AST: 15 U/L (ref 0–37)
Albumin: 4.1 g/dL (ref 3.5–5.2)
Alkaline Phosphatase: 53 U/L (ref 39–117)
BUN: 11 mg/dL (ref 6–23)
CO2: 31 meq/L (ref 19–32)
Calcium: 9.3 mg/dL (ref 8.4–10.5)
Chloride: 102 meq/L (ref 96–112)
Creatinine, Ser: 0.67 mg/dL (ref 0.40–1.20)
GFR: 104.44 mL/min (ref >60.00)
Glucose, Bld: 60 mg/dL — ABNORMAL LOW (ref 70–99)
Potassium: 4.2 meq/L (ref 3.5–5.1)
Sodium: 139 meq/L (ref 135–145)
Total Bilirubin: 0.3 mg/dL (ref 0.2–1.2)
Total Protein: 6.8 g/dL (ref 6.0–8.3)

## 2024-09-02 LAB — TSH: TSH: 3.97 u[IU]/mL (ref 0.35–5.50)

## 2024-09-02 LAB — CBC WITH DIFFERENTIAL/PLATELET
Basophils Absolute: 0 K/uL (ref 0.0–0.1)
Basophils Relative: 1.2 % (ref 0.0–3.0)
Eosinophils Absolute: 0.1 K/uL (ref 0.0–0.7)
Eosinophils Relative: 3.7 % (ref 0.0–5.0)
HCT: 38.5 % (ref 36.0–46.0)
Hemoglobin: 12.2 g/dL (ref 12.0–15.0)
Lymphocytes Relative: 46.8 % — ABNORMAL HIGH (ref 12.0–46.0)
Lymphs Abs: 1.7 K/uL (ref 0.7–4.0)
MCHC: 31.6 g/dL (ref 30.0–36.0)
MCV: 73.1 fl — ABNORMAL LOW (ref 78.0–100.0)
Monocytes Absolute: 0.5 K/uL (ref 0.1–1.0)
Monocytes Relative: 13 % — ABNORMAL HIGH (ref 3.0–12.0)
Neutro Abs: 1.3 K/uL — ABNORMAL LOW (ref 1.4–7.7)
Neutrophils Relative %: 35.3 % — ABNORMAL LOW (ref 43.0–77.0)
Platelets: 194 K/uL (ref 150.0–400.0)
RBC: 5.27 Mil/uL — ABNORMAL HIGH (ref 3.87–5.11)
RDW: 13.7 % (ref 11.5–15.5)
WBC: 3.5 K/uL — ABNORMAL LOW (ref 4.0–10.5)

## 2024-09-02 LAB — LIPID PANEL
Cholesterol: 149 mg/dL (ref 0–200)
HDL: 64 mg/dL (ref >39.00)
LDL Cholesterol: 76 mg/dL (ref 0–99)
NonHDL: 84.81
Total CHOL/HDL Ratio: 2
Triglycerides: 46 mg/dL (ref 0.0–149.0)
VLDL: 9.2 mg/dL (ref 0.0–40.0)

## 2024-09-02 LAB — VITAMIN D 25 HYDROXY (VIT D DEFICIENCY, FRACTURES): VITD: 19.97 ng/mL — ABNORMAL LOW (ref 30.00–100.00)

## 2024-09-02 LAB — IBC + FERRITIN
Ferritin: 19.7 ng/mL (ref 10.0–291.0)
Iron: 61 ug/dL (ref 42–145)
Saturation Ratios: 17.6 % — ABNORMAL LOW (ref 20.0–50.0)
TIBC: 345.8 ug/dL (ref 250.0–450.0)
Transferrin: 247 mg/dL (ref 212.0–360.0)

## 2024-09-02 LAB — HEMOGLOBIN A1C: Hgb A1c MFr Bld: 5.3 % (ref 4.6–6.5)

## 2024-09-02 NOTE — Patient Instructions (Signed)
 Thank you for choosing [facility] at [facility] for your Primary Care needs. I am excited for the opportunity to partner with you to meet your health care goals. It was a pleasure meeting you today!  Information on diet, exercise, and health maintenance recommendations are listed below. This is information to help you be sure you are on track for optimal health and monitoring.   Please look over this and let us know if you have any questions or if you have completed any of the health maintenance outside of [facility] so that we can be sure your records are up to date.  ___________________________________________________________  MyChart:  For all urgent or time sensitive needs we ask that you please call the office to avoid delays. Our number is [phone]. MyChart is not constantly monitored and due to the large volume of messages a day, replies may take up to 72 business hours.  MyChart Policy: MyChart allows for you to see your visit notes, after visit summary, provider recommendations, lab and tests results, make an appointment, request refills, and contact your provider or the office for non-urgent questions or concerns. Providers are seeing patients during normal business hours and do not have built in time to review MyChart messages.  We ask that you allow a minimum of 3 business days for responses to [employer]. For this reason, please do not send urgent requests through MyChart. Please call the office at [phone]. New and ongoing conditions may require a visit. We have virtual and in-person visits available for your convenience.  Complex MyChart concerns may require a visit. Your provider may request you schedule a virtual or in-person visit to ensure we are providing the best care possible. MyChart messages sent after 11:00 AM on Friday may not be received by the provider until Monday morning.    Lab and Test Results: You will receive your lab and test  results on MyChart as soon as they are completed and results have been sent by the lab or testing facility. Due to this service, you will receive your results BEFORE your provider.  I review lab and test results each morning prior to seeing patients. Some results require collaboration with other providers to ensure you are receiving the most appropriate care. For this reason, we ask that you please allow a minimum of 3-5 business days from the time that ALL results have been received for your provider to receive and review lab and test results and contact you about these.  Most lab and test result comments from the provider will be sent through MyChart. Your provider may recommend changes to the plan of care, follow-up visits, repeat testing, ask questions, or request an office visit to discuss these results. You may reply directly to this message or call the office to provide information for the provider or set up an appointment. In some instances, you will be called with test results and recommendations. Please let us know if this is preferred and we will make note of this in your chart to provide this for you.    If you have not heard a response to your lab or test results in 5 business days from all results returning to MyChart, please call the office to let us know. We ask that you please avoid calling prior to this time unless there is an emergent concern. Due to high call volumes, this can delay the resulting process.  After Hours: For all non-emergency after hours needs, please  call the office at [phone] and select the option to reach the on-call  service. On-call services are shared between multiple [place] offices and therefore it will not be possible to speak directly with your provider. On-call providers may provide medical advice and recommendations, but are unable to provide refills for maintenance medications.  For all emergency or urgent medical needs after normal business hours, we  recommend that you seek care at the closest Urgent Care or Emergency Department to ensure appropriate treatment in a timely manner.  [facility] has a 24 hour emergency room located on the ground floor for your convenience.   Urgent Concerns During the Business Day Providers are seeing patients from 8AM to 5PM with a busy schedule and are most often not able to respond to non-urgent calls until the end of the day or the next business day. If you should have URGENT concerns during the day, please call and speak to the nurse or schedule a same day appointment so that we can address your concern without delay.   Thank you, again, for choosing me as your health care partner. I appreciate your trust and look forward to learning more about you!   Summer Marrow Reola Calkins, DNP, FNP-C  ___________________________________________________________  Health Maintenance Recommendations Screening Testing Mammogram Every 1-2 years based on history and risk factors Starting at age 60 Pap Smear Ages 21-39 every 3 years Ages 22-65 every 5 years with HPV testing More frequent testing may be required based on results and history Colon Cancer Screening Every 1-10 years based on test performed, risk factors, and history Starting at age 29 Bone Density Screening Every 2-10 years based on history Starting at age 79 for women Recommendations for men differ based on medication usage, history, and risk factors AAA Screening One time ultrasound Men 59-28 years old who have ever smoked Lung Cancer Screening Low Dose Lung CT every 12 months Age 51-80 years with a 20 pack-year smoking history who still smoke or who have quit within the last 15 years  Screening Labs Routine  Labs: Complete Blood Count (CBC), Complete Metabolic Panel (CMP), Cholesterol (Lipid Panel) Every 6-12 months based on history and medications May be recommended more frequently based on current conditions or previous results Hemoglobin  A1c Lab Every 3-12 months based on history and previous results Starting at age 79 or earlier with diagnosis of diabetes, high cholesterol, BMI >26, and/or risk factors Frequent monitoring for patients with diabetes to ensure blood sugar control Thyroid Panel  Every 6 months based on history, symptoms, and risk factors May be repeated more often if on medication HIV One time testing for all patients 66 and older May be repeated more frequently for patients with increased risk factors or exposure Hepatitis C One time testing for all patients 59 and older May be repeated more frequently for patients with increased risk factors or exposure Gonorrhea, Chlamydia Every 12 months for all sexually active persons 13-24 years Additional monitoring may be recommended for those who are considered high risk or who have symptoms PSA Men 69-17 years old with risk factors Additional screening may be recommended from age 43-69 based on risk factors, symptoms, and history  Vaccine Recommendations Tetanus Booster All adults every 10 years Flu Vaccine All patients 6 months and older every year COVID Vaccine All patients 12 years and older Initial dosing with booster May recommend additional booster based on age and health history HPV Vaccine 2 doses all patients age 71-26 Dosing may be considered  for patients over 26 Shingles Vaccine (Shingrix) 2 doses all adults 50 years and older Pneumonia (Pneumovax 23) All adults 65 years and older May recommend earlier dosing based on health history Pneumonia (Prevnar 53) All adults 65 years and older Dosed 1 year after Pneumovax 23 Pneumonia (Prevnar 20) All adults 65 years and older (adults 19-64 with certain conditions or risk factors) 1 dose  For those who have not received Prevnar 13 vaccine previously   Additional Screening, Testing, and Vaccinations may be recommended on an individualized basis based on family history, health history, risk  factors, and/or exposure.  __________________________________________________________  Diet Recommendations for All Patients  I recommend that all patients maintain a diet low in saturated fats, carbohydrates, and cholesterol. While this can be challenging at first, it is not impossible and small changes can make big differences.  Things to try: Decreasing the amount of soda, sweet tea, and/or juice to one or less per day and replace with water While water is always the first choice, if you do not like water you may consider adding a water additive without sugar to improve the taste other sugar free drinks Replace potatoes with a brightly colored vegetable  Use healthy oils, such as canola oil or olive oil, instead of butter or hard margarine Limit your bread intake to two pieces or less a day Replace regular pasta with low carb pasta options Bake, broil, or grill foods instead of frying Monitor portion sizes  Eat smaller, more frequent meals throughout the day instead of large meals  An important thing to remember is, if you love foods that are not great for your health, you don't have to give them up completely. Instead, allow these foods to be a reward when you have done well. Allowing yourself to still have special treats every once in a while is a nice way to tell yourself thank you for working hard to keep yourself healthy.   Also remember that every day is a new day. If you have a bad day and "fall off the wagon", you can still climb right back up and keep moving along on your journey!  We have resources available to help you!  Some websites that may be helpful include: www.http://www.wall-moore.info/  Www.VeryWellFit.com _____________________________________________________________  Activity Recommendations for All Patients  I recommend that all adults get at least 30 minutes of moderate physical activity that elevates your heart rate at least 5 days out of the week.  Some examples  include: Walking or jogging at a pace that allows you to carry on a conversation Cycling (stationary bike or outdoors) Water aerobics Yoga Weight lifting Dancing If physical limitations prevent you from putting stress on your joints, exercise in a pool or seated in a chair are excellent options.  Do determine your MAXIMUM heart rate for activity: 220 - YOUR AGE = MAX Heart Rate   Remember! Do not push yourself too hard.  Start slowly and build up your pace, speed, weight, time in exercise, etc.  Allow your body to rest between exercise and get good sleep. You will need more water than normal when you are exerting yourself. Do not wait until you are thirsty to drink. Drink with a purpose of getting in at least 8, 8 ounce glasses of water a day plus more depending on how much you exercise and sweat.    If you begin to develop dizziness, chest pain, abdominal pain, jaw pain, shortness of breath, headache, vision changes, lightheadedness, or other concerning symptoms,  stop the activity and allow your body to rest. If your symptoms are severe, seek emergency evaluation immediately. If your symptoms are concerning, but not severe, please let us know so that we can recommend further evaluation.

## 2024-09-02 NOTE — Progress Notes (Signed)
 Complete physical exam  Patient: Summer Murillo   DOB: March 22, 1976   48 y.o. Female  MRN: [id number]  Subjective:    Chief Complaint  Patient presents with   Establish Care    Summer Murillo is a 48 y.o. female who presents today for a complete physical exam. She reports consuming a general diet. The patient has a physically strenuous job, but has no regular exercise apart from work.  She generally feels well. She reports sleeping well. She does not have additional problems to discuss today.   Currently lives with: mother Acute concerns or interim problems since last visit: no  Vision concerns: no, due for regular eye exam Dental concerns: no STD concerns: no   ETOH use: no Nicotine use: no Recreational drugs/illegal substances: no   Females:  She is not currently  sexually active  Contraception choices are: abstinence LMP: 08/24/24      Most recent fall risk assessment:    09/02/2024   10:04 AM  Fall Risk   Falls in the past year? 0  Number falls in past yr: 0  Injury with Fall? 0  Risk for fall due to : No Fall Risks  Follow up Falls evaluation completed     Most recent depression screenings:    09/02/2024   10:04 AM  PHQ 2/9 Scores  PHQ - 2 Score 0  PHQ- 9 Score 0           Patient Care Team: Almarie Waddell NOVAK, NP as PCP - General (Family Medicine)   Outpatient Medications Prior to Visit  Medication Sig   ASHWAGANDHA PO Take by mouth.   OVER THE COUNTER MEDICATION Tumeric curcumin & Ginger w/ black pepper extract   [DISCONTINUED] chlorpheniramine (CHLOR-TRIMETON) 4 MG tablet Take 4 mg by mouth 2 (two) times daily as needed for allergies. (Patient not taking: Reported on 07/03/2023)   [DISCONTINUED] MAGNESIUM CITRATE PO Take by mouth. 900mg    [DISCONTINUED] Multiple Vitamins-Minerals (MULTI-VITAMIN GUMMIES) CHEW Chew by mouth. (Patient not taking: Reported on 07/03/2023)   [DISCONTINUED] OVER THE COUNTER MEDICATION Allergena Allergy Relief drops  (Patient not taking: Reported on 07/03/2023)   No facility-administered medications prior to visit.    ROS All review of systems negative except what is listed in the HPI        Objective:     BP 115/77   Pulse 70   Ht 5' 5.5 (1.664 m)   Wt 155 lb (70.3 kg)   SpO2 100%   BMI 25.40 kg/m    Physical Exam Vitals reviewed.  Constitutional:      General: She is not in acute distress.    Appearance: Normal appearance. She is not ill-appearing.  HENT:     Head: Normocephalic and atraumatic.     Right Ear: Tympanic membrane normal.     Left Ear: Tympanic membrane normal.     Nose: Nose normal.     Mouth/Throat:     Mouth: Mucous membranes are moist.     Pharynx: Oropharynx is clear.  Eyes:     Extraocular Movements: Extraocular movements intact.     Conjunctiva/sclera: Conjunctivae normal.     Pupils: Pupils are equal, round, and reactive to light.  Cardiovascular:     Rate and Rhythm: Normal rate and regular rhythm.     Pulses: Normal pulses.     Heart sounds: Normal heart sounds.  Pulmonary:     Effort: Pulmonary effort is normal.     Breath sounds: Normal  breath sounds.  Abdominal:     General: Abdomen is flat. Bowel sounds are normal. There is no distension.     Palpations: Abdomen is soft. There is no mass.     Tenderness: There is no abdominal tenderness. There is no right CVA tenderness, left CVA tenderness, guarding or rebound.  Genitourinary:    Comments: Deferred exam Musculoskeletal:        General: Normal range of motion.     Cervical back: Normal range of motion and neck supple. No tenderness.     Right lower leg: No edema.     Left lower leg: No edema.  Lymphadenopathy:     Cervical: No cervical adenopathy.  Skin:    General: Skin is warm and dry.     Capillary Refill: Capillary refill takes less than 2 seconds.  Neurological:     General: No focal deficit present.     Mental Status: She is alert and oriented to person, place, and time. Mental  status is at baseline.  Psychiatric:        Mood and Affect: Mood normal.        Behavior: Behavior normal.        Thought Content: Thought content normal.        Judgment: Judgment normal.         No results found for any visits on 09/02/24.     Assessment & Plan:    Routine Health Maintenance and Physical Exam Discussed health promotion and safety including diet and exercise recommendations, dental health, and injury prevention. Tobacco cessation if applicable. Seat belts, sunscreen, smoke detectors, etc.    Immunization History  Administered Date(s) Administered   Influenza,inj,Quad PF,6+ Mos 10/07/2022   Influenza,inj,quad, With Preservative 09/04/2015   Influenza,trivalent, recombinat, inj, PF 08/16/2023   Janssen (J&J) SARS-COV-2 Vaccination 06/03/2020, 10/01/2020    Health Maintenance  Topic Date Due   HIV Screening  Never done   Hepatitis C Screening  Never done   Cervical Cancer Screening (HPV/Pap Cotest)  Never done   Colonoscopy  Never done   Mammogram  11/15/2023   Influenza Vaccine  07/04/2024   Hepatitis B Vaccines 19-59 Average Risk (1 of 3 - 19+ 3-dose series) 09/01/2025 (Originally 09/26/1996)   COVID-19 Vaccine (3 - 2025-26 season) 09/01/2025 (Originally 08/04/2024)   DTaP/Tdap/Td (1 - Tdap) 09/02/2025 (Originally 09/26/1996)   Pneumococcal Vaccine  Aged Out   HPV VACCINES  Aged Out   Meningococcal B Vaccine  Aged Out        Problem List Items Addressed This Visit       Active Problems   Vitamin D  deficiency   Relevant Orders   VITAMIN D  25 Hydroxy (Vit-D Deficiency, Fractures)   Iron deficiency   Relevant Orders   CBC with Differential/Platelet   IBC + Ferritin   Other Visit Diagnoses       Annual physical exam    -  Primary   Relevant Orders   Ambulatory referral to Gastroenterology   CBC with Differential/Platelet   Comprehensive metabolic panel with GFR   IBC + Ferritin   Lipid panel   TSH   VITAMIN D  25 Hydroxy (Vit-D  Deficiency, Fractures)   Hemoglobin A1c     Encounter for medical examination to establish care         Screening for diabetes mellitus       Relevant Orders   Hemoglobin A1c     Screen for colon cancer       Relevant  Orders   Ambulatory referral to Gastroenterology          PATIENT COUNSELING:   Advised to take 1 mg of folate supplement per day if capable of pregnancy.    Recommend that most people either abstain from alcohol or drink within safe limits (<=14/week and <=4 drinks/occasion for males, <=7/weeks and <= 3 drinks/occasion for females) and that the risk for alcohol disorders and other health effects rises proportionally with the number of drinks per week and how often a drinker exceeds daily limits.  Diet: Recommend to adjust caloric intake to maintain or achieve ideal body weight, to reduce intake of dietary saturated fat and total fat, to limit sodium intake by avoiding high sodium foods and not adding table salt, and to maintain adequate dietary potassium and calcium preferably from fresh fruits, vegetables, and low-fat dairy products.   Emphasized the importance of regular exercise.  Injury prevention: Recommend seatbelts, safety helmets, smoke detector, etc..   Dental health: Recommend regular tooth brushing, flossing, and dental visits.       Return in about 1 year (around 09/02/2025) for physical.     Waddell KATHEE Mon, NP

## 2024-09-03 ENCOUNTER — Ambulatory Visit: Payer: Self-pay | Admitting: Family Medicine

## 2024-10-02 ENCOUNTER — Ambulatory Visit: Admitting: Medical

## 2024-10-02 ENCOUNTER — Ambulatory Visit (HOSPITAL_BASED_OUTPATIENT_CLINIC_OR_DEPARTMENT_OTHER)
Admission: RE | Admit: 2024-10-02 | Discharge: 2024-10-02 | Disposition: A | Discharge status: Home / Self Care | Source: Ambulatory Visit | Attending: Medical | Admitting: Medical

## 2024-10-02 ENCOUNTER — Ambulatory Visit: Payer: Self-pay

## 2024-10-02 ENCOUNTER — Other Ambulatory Visit: Payer: Self-pay | Admitting: Medical

## 2024-10-02 ENCOUNTER — Ambulatory Visit: Payer: Self-pay | Admitting: Medical

## 2024-10-02 VITALS — BP 120/82 | HR 75 | Temp 98.4°F | Resp 17 | Ht 65.5 in | Wt 158.0 lb

## 2024-10-02 DIAGNOSIS — J4520 Mild intermittent asthma, uncomplicated: Secondary | ICD-10-CM | POA: Diagnosis not present

## 2024-10-02 DIAGNOSIS — R059 Cough, unspecified: Secondary | ICD-10-CM | POA: Diagnosis not present

## 2024-10-02 DIAGNOSIS — R051 Acute cough: Secondary | ICD-10-CM | POA: Insufficient documentation

## 2024-10-02 DIAGNOSIS — R062 Wheezing: Secondary | ICD-10-CM

## 2024-10-02 LAB — POC COVID19 BINAXNOW: SARS Coronavirus 2 Ag: NEGATIVE

## 2024-10-02 LAB — POCT RAPID STREP A (OFFICE): Rapid Strep A Screen: NEGATIVE

## 2024-10-02 MED ORDER — FLUTICASONE PROPIONATE 50 MCG/ACT NA SUSP
2.0000 | Freq: Every day | NASAL | 1 refills | Status: AC
Start: 2024-10-02 — End: ?

## 2024-10-02 MED ORDER — BENZONATATE 100 MG PO CAPS: 100.0000 mg | ORAL_CAPSULE | Freq: Three times a day (TID) | ORAL | 0 refills | Status: AC | PRN

## 2024-10-02 MED ORDER — METHYLPREDNISOLONE 4 MG PO TBPK: ORAL_TABLET | ORAL | 0 refills | Status: AC

## 2024-10-02 MED ORDER — ALBUTEROL SULFATE HFA 108 (90 BASE) MCG/ACT IN AERS: 2.0000 | INHALATION_SPRAY | Freq: Four times a day (QID) | RESPIRATORY_TRACT | 0 refills | Status: AC | PRN

## 2024-10-02 NOTE — Progress Notes (Signed)
 Subjective:    Patient ID: Summer Murillo, female    DOB: 17-Mar-1976, 48 y.o.   MRN: [id number]  HPI  Summer Murillo is a 48 year old female who presents with a productive cough and shortness of breath.  She has been experiencing a cough and shortness of breath for about a week, with the cough starting last Saturday. Initially, there was a sensation of chest tightness and fatigue. Despite using home remedies and over-the-counter medications, there has been no relief, prompting her to seek medical attention.  The cough began as dry but has since become productive, with yellow and blood-tinged sputum, and more recently, green sputum. No fever, chills, sweats, or body aches are present. Shortness of breath is particularly noticeable with exertion, such as climbing stairs, and friends have noticed wheezing, although she did not initially.  Her past medical history includes frequent bronchitis and a few episodes of walking pneumonia.  She denies any history of asthma but mentions occasional wheezing and inhaler use when sick in the past.  She has been using Mucinex, Tussin DM, Alka-Seltzer Plus, and herbal remedies like bronchial syrup and Osra Root, but these have not been effective. She has been using a mask at work. Nasal congestion is present, and she has been using Flonase nasal spray.       Lmp- 09-21-2024.   Pt never smoked in past.   Review of Systems  Constitutional:  Negative for chills and fatigue.  Respiratory:  Positive for cough, shortness of breath and wheezing.   Cardiovascular:  Negative for chest pain and palpitations.  Gastrointestinal:  Negative for abdominal pain, constipation, nausea and vomiting.  Genitourinary:  Negative for dysuria.  Musculoskeletal:  Negative for back pain, joint swelling and neck pain.  Skin:  Negative for rash.  Neurological:  Negative for dizziness, syncope, light-headedness and headaches.  Hematological:  Negative for adenopathy.   Psychiatric/Behavioral:  Negative for behavioral problems, decreased concentration, self-injury and suicidal ideas.     Past Medical History:  Diagnosis Date   Anemia    Seasonal allergies      Social History   Socioeconomic History   Marital status: Single    Spouse name: Not on file   Number of children: Not on file   Years of education: Not on file   Highest education level: Not on file  Occupational History   Not on file  Tobacco Use   Smoking status: Never   Smokeless tobacco: Never  Substance and Sexual Activity   Alcohol use: Not Currently   Drug use: Not Currently   Sexual activity: Not Currently  Other Topics Concern   Not on file  Social History Narrative   Not on file   Social Drivers of Health   Financial Resource Strain: Not on file  Food Insecurity: Low Risk  (07/01/2024)   Received from [facility]   Hunger Vital Sign    Within the past 12 months, you worried that your food would run out before you got money to buy more: Never true    Within the past 12 months, the food you bought just didn't last and you didn't have money to get more. : Patient declined to answer  Transportation Needs: No Transportation Needs (07/01/2024)   Received from [facility]    In the past 12 months, has lack of reliable transportation kept you from medical appointments, meetings, work or from getting things needed for daily living? : No  Physical Activity:  Not on file  Stress: Not on file  Social Connections: Not on file  Intimate Partner Violence: Not on file    Past Surgical History:  Procedure Laterality Date   LAPAROSCOPY  2008   endometriosis    Family History  Problem Relation Age of Onset   Heart attack Mother    Arthritis Mother    Stomach cancer Mother    Epilepsy Father    CVA Father    Hypertension Father    Healthy Sister    Hypertension Maternal Grandmother    Alzheimer's disease Maternal Grandmother    Leukemia Paternal  Grandmother    Breast cancer Other     Allergies  Allergen Reactions   Erythromycin Hives and Swelling   Penicillins Anaphylaxis and Hives    As a child unsure now   Milk Protein Nausea And Vomiting    Current Outpatient Medications on File Prior to Visit  Medication Sig Dispense Refill   ASHWAGANDHA PO Take by mouth.     OVER THE COUNTER MEDICATION Tumeric curcumin & Ginger w/ black pepper extract     No current facility-administered medications on file prior to visit.    BP 120/82   Pulse 75   Temp 98.4 F (36.9 C) (Oral)   Resp 17   Ht 5' 5.5 (1.664 m)   Wt 158 lb (71.7 kg)   LMP 09/21/2024 (Exact Date)   SpO2 99%   BMI 25.89 kg/m        Objective:   Physical Exam  General Mental Status- Alert. General Appearance- Not in acute distress. Sounds nasal congested  Skin General: Color- Normal Color. Moisture- Normal Moisture.  Neck  No JVD.  Chest and Lung Exam Auscultation: Breath Sounds:-Normal.  Cardiovascular Auscultation:Rythm- Regular. Murmurs & Other Heart Sounds:Auscultation of the heart reveals- No Murmurs.  Abdomen Inspection:-Inspeection Normal. Palpation/Percussion:Note:No mass. Palpation and Percussion of the abdomen reveal- Non Tender, Non Distended + BS, no rebound or guarding.    Neurologic Cranial Nerve exam:- CN III-XII intact(No nystagmus), symmetric smile. Drift Test:- No drift. Romberg Exam:- Negative.  Heal to Toe Gait exam:-Normal. Finger to Nose:- Normal/Intact Strength:- 5/5 equal and symmetric strength both upper and lower extremities.   Lower ext- calfs symmetric, negative homans signs. No edema bilaterally.    Assessment & Plan:   Acute productive cough with shortness of breath and wheezing(reactive airway disease) Acute cough with yellowish-brown, occasionally blood-tinged sputum, shortness of breath, and wheezing. - Order chest X-ray to rule out pneumonia before possibly prescribing antibiotics. - Prescribe  benzonatate for cough. - Prescribe albuterol inhaler, 2 puffs every 6 hours as needed for shortness of breath or wheezing. - Provide Medrol 4 mg taper dose pack as a conditional plan if albuterol is needed every 6 hours. -Flonase nasal spray for nasal congestion - Advise to monitor albuterol use and report if frequent use is needed. - Update provider via MyChart in 5-7 days regarding symptom resolution. -strep and covid test negative  Nasal congestion - Prescribe Flonase nasal spray.  Follow up 7 days (can update me by my chart) or sooner if needed  Basem Yannuzzi, PA-C

## 2024-10-02 NOTE — Telephone Encounter (Signed)
 FYI Only or Action Required?: FYI only for provider: appointment scheduled on 10/30.  Patient was last seen in primary care on 09/02/2024 by Almarie Waddell NOVAK, NP.  Called Nurse Triage reporting Cough.  Symptoms began several days ago.  Interventions attempted: OTC medications: Cough Syrup, Theraflu .  Symptoms are: unchanged.  Triage Disposition: See HCP Within 4 Hours (Or PCP Triage)  Patient/caregiver understands and will follow disposition?: Yes       Copied from CRM #[id number]. Topic: Clinical - Red Word Triage >> Oct 02, 2024  7:57 AM Russell PARAS wrote: Red Word that prompted transfer to Nurse Triage:   Symptoms started last Thursday Cough Chest tightness Chest congestion Large amounts of phlegm, discolored-deep green, some blood present yesterday morning Slight SOB today Tried OTC meds, not beneficial-usually helps  Pt of Dr. Almarie in [place] Reason for Disposition  [1] MILD difficulty breathing (e.g., minimal/no SOB at rest, SOB with walking, pulse < 100) AND [2] still present when not coughing  Answer Assessment - Initial Assessment Questions 1. ONSET: When did the cough begin?      X 5 days ago   2. SEVERITY: How bad is the cough today?      Constant   3. SPUTUM: Describe the color of your sputum (e.g., none, dry cough; clear, white, yellow, green)     Green   4. HEMOPTYSIS: Are you coughing up any blood? If Yes, ask: How much? (e.g., flecks, streaks, tablespoons, etc.)     No   5. DIFFICULTY BREATHING: Are you having difficulty breathing? If Yes, ask: How bad is it? (e.g., mild, moderate, severe)       SOB, and chest tightness intermittently-mild since more phlegm is being coughing up   6. FEVER: Do you have a fever? If Yes, ask: What is your temperature, how was it measured, and when did it start?     No   7. CARDIAC HISTORY: Do you have any history of heart disease? (e.g., heart attack, congestive heart failure)      No   8. LUNG  HISTORY: Do you have any history of lung disease?  (e.g., pulmonary embolus, asthma, emphysema)     No   9. PE RISK FACTORS: Do you have a history of blood clots? (or: recent major surgery, recent prolonged travel, bedridden)     No   10. OTHER SYMPTOMS: Do you have any other symptoms? (e.g., runny nose, wheezing, chest pain)  Chest congestion, SOB, Chest tightness related to cough .  Patient is taking OTC cough medicine Theraflu, Tussin DM. Appt. Scheduled to follow up.  Protocols used: Cough - Acute Productive-A-AH

## 2024-10-02 NOTE — Patient Instructions (Signed)
 Acute productive cough with shortness of breath and wheezing(reactive airway disease) Acute cough with yellowish-brown, occasionally blood-tinged sputum, shortness of breath, and wheezing. - Order chest X-ray to rule out pneumonia before possibly prescribing antibiotics. - Prescribe benzonatate for cough. - Prescribe albuterol inhaler, 2 puffs every 6 hours as needed for shortness of breath or wheezing. - Provide Medrol 4 mg taper dose pack as a conditional plan if albuterol is needed every 6 hours. -Flonase nasal spray for nasal congestion - Advise to monitor albuterol use and report if frequent use is needed. - Update provider via MyChart in 5-7 days regarding symptom resolution. -strep and covid test negative  Nasal congestion - Prescribe Flonase nasal spray.  Follow up 7 days (can update me by my chart) or sooner if needed

## 2024-10-27 ENCOUNTER — Ambulatory Visit: Admitting: Family Medicine

## 2024-12-02 ENCOUNTER — Ambulatory Visit (INDEPENDENT_AMBULATORY_CARE_PROVIDER_SITE_OTHER)

## 2024-12-02 DIAGNOSIS — Z23 Encounter for immunization: Secondary | ICD-10-CM | POA: Diagnosis not present

## 2024-12-02 NOTE — Progress Notes (Signed)
 Pt was in office today for a influenza vaccine, vaccine was given in R deltoid and pt tolerated well.

## 2024-12-22 ENCOUNTER — Encounter: Payer: Self-pay | Admitting: Internal Medicine

## 2025-01-06 ENCOUNTER — Ambulatory Visit: Admitting: *Deleted

## 2025-01-06 VITALS — Ht 65.5 in | Wt 155.0 lb

## 2025-01-06 DIAGNOSIS — Z1211 Encounter for screening for malignant neoplasm of colon: Secondary | ICD-10-CM

## 2025-01-06 MED ORDER — PEG 3350-KCL-NA BICARB-NACL 420 G PO SOLR: 4000.0000 mL | Freq: Once | ORAL | 0 refills | Status: AC

## 2025-01-27 ENCOUNTER — Encounter: Admitting: Internal Medicine

## 2025-09-04 ENCOUNTER — Encounter: Admitting: Family Medicine
